# Patient Record
Sex: Female | Born: 1960 | Race: Black or African American | Hispanic: No | Marital: Married | Smoking: Never smoker
Health system: Southern US, Community
[De-identification: ages and names within clinical notes are randomized; demographics above are authoritative.]

## PROBLEM LIST (undated history)

## (undated) DIAGNOSIS — I10 Essential (primary) hypertension: Secondary | ICD-10-CM

---

## 1998-08-25 ENCOUNTER — Emergency Department (HOSPITAL_COMMUNITY): Admission: EM | Admit: 1998-08-25 | Discharge: 1998-08-25 | Payer: Self-pay | Admitting: *Deleted

## 1999-05-26 ENCOUNTER — Other Ambulatory Visit: Admission: RE | Admit: 1999-05-26 | Discharge: 1999-05-26 | Payer: Self-pay | Admitting: Obstetrics and Gynecology

## 2000-06-16 ENCOUNTER — Other Ambulatory Visit: Admission: RE | Admit: 2000-06-16 | Discharge: 2000-06-16 | Payer: Self-pay | Admitting: Gynecology

## 2003-03-05 ENCOUNTER — Emergency Department (HOSPITAL_COMMUNITY): Admission: EM | Admit: 2003-03-05 | Discharge: 2003-03-05 | Payer: Self-pay | Admitting: Emergency Medicine

## 2003-04-25 ENCOUNTER — Encounter: Admission: RE | Admit: 2003-04-25 | Discharge: 2003-04-25 | Payer: Self-pay | Admitting: Internal Medicine

## 2003-06-12 ENCOUNTER — Encounter: Admission: RE | Admit: 2003-06-12 | Discharge: 2003-06-12 | Payer: Self-pay | Admitting: Obstetrics and Gynecology

## 2003-06-12 ENCOUNTER — Other Ambulatory Visit: Admission: RE | Admit: 2003-06-12 | Discharge: 2003-06-12 | Payer: Self-pay | Admitting: Obstetrics & Gynecology

## 2004-03-25 ENCOUNTER — Ambulatory Visit: Payer: Self-pay | Admitting: Obstetrics & Gynecology

## 2004-05-22 ENCOUNTER — Ambulatory Visit: Payer: Self-pay | Admitting: Obstetrics and Gynecology

## 2004-10-14 ENCOUNTER — Emergency Department (HOSPITAL_COMMUNITY): Admission: EM | Admit: 2004-10-14 | Discharge: 2004-10-14 | Payer: Self-pay | Admitting: Family Medicine

## 2005-02-10 ENCOUNTER — Other Ambulatory Visit: Admission: RE | Admit: 2005-02-10 | Discharge: 2005-02-10 | Payer: Self-pay | Admitting: Obstetrics & Gynecology

## 2005-02-10 ENCOUNTER — Ambulatory Visit: Payer: Self-pay | Admitting: *Deleted

## 2005-04-02 ENCOUNTER — Ambulatory Visit: Payer: Self-pay | Admitting: *Deleted

## 2005-04-03 ENCOUNTER — Ambulatory Visit (HOSPITAL_COMMUNITY): Admission: RE | Admit: 2005-04-03 | Discharge: 2005-04-03 | Payer: Self-pay | Admitting: Obstetrics & Gynecology

## 2005-06-09 ENCOUNTER — Other Ambulatory Visit: Admission: RE | Admit: 2005-06-09 | Discharge: 2005-06-09 | Payer: Self-pay | Admitting: Obstetrics & Gynecology

## 2005-06-09 ENCOUNTER — Ambulatory Visit: Payer: Self-pay | Admitting: Obstetrics & Gynecology

## 2005-06-29 ENCOUNTER — Ambulatory Visit: Payer: Self-pay | Admitting: Obstetrics & Gynecology

## 2005-06-29 ENCOUNTER — Inpatient Hospital Stay (HOSPITAL_COMMUNITY): Admission: RE | Admit: 2005-06-29 | Discharge: 2005-07-01 | Payer: Self-pay | Admitting: Obstetrics & Gynecology

## 2005-07-04 ENCOUNTER — Inpatient Hospital Stay (HOSPITAL_COMMUNITY): Admission: AD | Admit: 2005-07-04 | Discharge: 2005-07-04 | Payer: Self-pay | Admitting: *Deleted

## 2005-07-22 ENCOUNTER — Ambulatory Visit: Payer: Self-pay | Admitting: Obstetrics & Gynecology

## 2005-08-13 ENCOUNTER — Ambulatory Visit: Payer: Self-pay | Admitting: *Deleted

## 2012-07-02 ENCOUNTER — Emergency Department (INDEPENDENT_AMBULATORY_CARE_PROVIDER_SITE_OTHER)
Admission: EM | Admit: 2012-07-02 | Discharge: 2012-07-02 | Disposition: A | Discharge status: Home / Self Care | Payer: BC Managed Care – PPO | Source: Home / Self Care | Attending: Family Medicine | Admitting: Family Medicine

## 2012-07-02 ENCOUNTER — Encounter (HOSPITAL_COMMUNITY): Payer: Self-pay | Admitting: *Deleted

## 2012-07-02 DIAGNOSIS — Z760 Encounter for issue of repeat prescription: Secondary | ICD-10-CM

## 2012-07-02 DIAGNOSIS — I10 Essential (primary) hypertension: Secondary | ICD-10-CM

## 2012-07-02 HISTORY — DX: Essential (primary) hypertension: I10

## 2012-07-02 MED ORDER — HYDROCHLOROTHIAZIDE 25 MG PO TABS: 25.0000 mg | ORAL_TABLET | Freq: Every day | ORAL | Status: AC

## 2012-07-02 NOTE — ED Notes (Signed)
Patient states that her BP is up and that she has needs to get a refill on her Hydrochlorothiazide. Patient denies headache and dizziness.

## 2012-07-06 NOTE — ED Provider Notes (Signed)
History     CSN: [id number]  Arrival date & time 07/02/12  1636   First MD Initiated Contact with Patient 07/02/12 1638      Chief Complaint  Patient presents with  . Hypertension    (Consider location/radiation/quality/duration/timing/severity/associated sxs/prior treatment) HPI Comments: 52 year old nonsmoker female with history of hypertension. Here requesting refills on her hydrochlorothiazide. States she ran out of her medications a few days ago. She is in the process of establishing a primary care provider or new patient visit and has an appointment next week. Denies headache, visual changes, chest pain, shortness of breath, leg swelling or PND. She is otherwise feeling well today.   Past Medical History  Diagnosis Date  . Hypertension     History reviewed. No pertinent past surgical history.  No family history on file.  History  Substance Use Topics  . Smoking status: Never Smoker   . Smokeless tobacco: Not on file  . Alcohol Use: No    OB History   Grav Para Term Preterm Abortions TAB SAB Ect Mult Living                  Review of Systems  Constitutional: Negative for diaphoresis, fatigue and unexpected weight change.  Eyes: Negative for visual disturbance.  Respiratory: Negative for chest tightness and shortness of breath.   Cardiovascular: Negative for chest pain, palpitations and leg swelling.  Neurological: Negative for dizziness and headaches.    Allergies  Review of patient's allergies indicates no known allergies.  Home Medications   Current Outpatient Rx  Name  Route  Sig  Dispense  Refill  . hydrochlorothiazide (HYDRODIURIL) 25 MG tablet   Oral   Take 1 tablet (25 mg total) by mouth daily.   30 tablet   1     BP 186/92  Pulse 74  Temp(Src) 98.4 F (36.9 C) (Oral)  Resp 18  SpO2 95%  Physical Exam  Nursing note and vitals reviewed. Constitutional: She is oriented to person, place, and time. She appears well-developed and  well-nourished. No distress.  HENT:  Head: Normocephalic and atraumatic.  Neck: No JVD present.  Cardiovascular: Normal rate, regular rhythm, normal heart sounds and intact distal pulses.  Exam reveals no gallop and no friction rub.   No murmur heard. Pulmonary/Chest: Effort normal and breath sounds normal.  Neurological: She is alert and oriented to person, place, and time.  Skin: No rash noted. She is not diaphoretic.    ED Course  Procedures (including critical care time)  Labs Reviewed - No data to display No results found.   1. Hypertension   2. Encounter for medication refill       MDM  Hydrochlorothiazide refill past previously prescribed. Encouraged patient to followup with primary care clinic new patient's appointment next week. Red flags that should prompt her return to medical attention discussed with patient and provided in writing.        Sharin Grave, MD 07/07/12 [phone]

## 2012-07-14 ENCOUNTER — Encounter: Payer: Self-pay | Admitting: Internal Medicine

## 2012-07-15 ENCOUNTER — Other Ambulatory Visit: Payer: Self-pay | Admitting: Internal Medicine

## 2012-07-15 DIAGNOSIS — N63 Unspecified lump in unspecified breast: Secondary | ICD-10-CM

## 2012-07-26 ENCOUNTER — Other Ambulatory Visit: Payer: BC Managed Care – PPO

## 2012-08-01 ENCOUNTER — Other Ambulatory Visit: Payer: BC Managed Care – PPO

## 2012-08-05 ENCOUNTER — Telehealth: Payer: Self-pay | Admitting: *Deleted

## 2012-08-05 NOTE — Telephone Encounter (Signed)
No show for previsit. No answer on home phone. Sent no show letter.

## 2012-08-08 ENCOUNTER — Ambulatory Visit
Admission: RE | Admit: 2012-08-08 | Discharge: 2012-08-08 | Disposition: A | Discharge status: Home / Self Care | Payer: BC Managed Care – PPO | Source: Ambulatory Visit | Attending: Internal Medicine | Admitting: Internal Medicine

## 2012-08-08 DIAGNOSIS — N63 Unspecified lump in unspecified breast: Secondary | ICD-10-CM

## 2012-08-19 ENCOUNTER — Encounter: Payer: BC Managed Care – PPO | Admitting: Internal Medicine

## 2012-08-31 ENCOUNTER — Encounter: Payer: Self-pay | Admitting: General Practice

## 2013-04-19 ENCOUNTER — Ambulatory Visit: Payer: Self-pay | Admitting: Emergency Medicine

## 2013-04-20 ENCOUNTER — Ambulatory Visit: Payer: BC Managed Care – PPO | Admitting: Physician Assistant

## 2013-06-17 ENCOUNTER — Encounter (HOSPITAL_COMMUNITY): Payer: Self-pay | Admitting: Emergency Medicine

## 2013-06-17 ENCOUNTER — Emergency Department (INDEPENDENT_AMBULATORY_CARE_PROVIDER_SITE_OTHER)
Admission: EM | Admit: 2013-06-17 | Discharge: 2013-06-17 | Disposition: A | Discharge status: Home / Self Care | Payer: BC Managed Care – PPO | Source: Home / Self Care | Attending: Emergency Medicine | Admitting: Emergency Medicine

## 2013-06-17 DIAGNOSIS — J209 Acute bronchitis, unspecified: Secondary | ICD-10-CM

## 2013-06-17 DIAGNOSIS — J019 Acute sinusitis, unspecified: Secondary | ICD-10-CM

## 2013-06-17 MED ORDER — ALBUTEROL SULFATE HFA 108 (90 BASE) MCG/ACT IN AERS: 2.0000 | INHALATION_SPRAY | Freq: Four times a day (QID) | RESPIRATORY_TRACT | Status: AC

## 2013-06-17 MED ORDER — GUAIFENESIN-CODEINE 100-10 MG/5ML PO SYRP: 10.0000 mL | ORAL_SOLUTION | Freq: Four times a day (QID) | ORAL | Status: DC | PRN

## 2013-06-17 MED ORDER — AMOXICILLIN 500 MG PO CAPS: 1000.0000 mg | ORAL_CAPSULE | Freq: Three times a day (TID) | ORAL | Status: DC

## 2013-06-17 MED ORDER — PREDNISONE 20 MG PO TABS: 20.0000 mg | ORAL_TABLET | Freq: Two times a day (BID) | ORAL | Status: DC

## 2013-06-17 MED ORDER — FLUTICASONE PROPIONATE 50 MCG/ACT NA SUSP: 2.0000 | Freq: Every day | NASAL | Status: AC

## 2013-06-17 NOTE — ED Notes (Signed)
C/o cold sx States she has a cough, nasal congestion, sneezing, and sore throat due to coughing OTC medication taken

## 2013-06-17 NOTE — Discharge Instructions (Signed)
Most upper respiratory infections are caused by viruses and do not require antibiotics.  We try to save the antibiotics for when we really need them to prevent bacteria from developing resistance to them.  Here are a few hints about things that can be done at home to help get over an upper respiratory infection quicker: ° °Get extra sleep and extra fluids.  Get 7 to 9 hours of sleep per night and 6 to 8 glasses of water a day.  Getting extra sleep keeps the immune system from getting run down.  Most people with an upper respiratory infection are a little dehydrated.  The extra fluids also keep the secretions liquified and easier to deal with.  Also, get extra vitamin C.  4000 mg per day is the recommended dose. °For the aches, headache, and fever, acetaminophen or ibuprofen are helpful.  These can be alternated every 4 hours.  People with liver disease should avoid large amounts of acetaminophen, and people with ulcer disease, gastroesophageal reflux, gastritis, congestive heart failure, chronic kidney disease, coronary artery disease and the elderly should avoid ibuprofen. °For nasal congestion try Mucinex-D, or if you're having lots of sneezing or clear nasal drainage use Zyrtec-D. People with high blood pressure can take these if their blood pressure is controlled, if not, it's best to avoid the forms with a "D" (decongestants).  You can use the plain Mucinex, Allegra, Claritin, or Zyrtec even if your blood pressure is not controlled.   °A Saline nasal spray such as Ocean Spray can also help.  You can add a decongestant sprays such as Afrin, but you should not use the decongestant sprays for more than 3 or 4 days since they can be habituating.  Breathe Rite nasal strips can also offer a non-drug alternative treatment to nasal congestion, especially at night. °For people with symptoms of sinusitis, sleeping with your head elevated can be helpful.  For sinus pain, moist, hot compresses to the face may provide some  relief.  Many people find that inhaling steam as in a shower or from a pot of steaming water can help. °For any viral infection, zinc containing lozenges such as Cold-Eze or Zicam are helpful.  Zinc helps to fight viral infection.  Hot salt water gargles (8 oz of hot water, 1/2 tsp of table salt, and a pinch of baking soda) can give relief as well as hot beverages such as hot tea.  Sucrets extra strength lozenges will help the sore throat.  °For the cough, take Delsym 2 tsp every 12 hours.  It has also been found recently that Aleve can help control a cough.  The dose is 1 to 2 tablets twice daily with food.  This can be combined with Delsym. (Note, if you are taking ibuprofen, you should not take Aleve as well--take one or the other.) °A cool mist vaporizer will help keep your mucous membranes from drying out.  ° °It's important when you have an upper respiratory infection not to pass the infection to others.  This involves being very careful about the following: ° °Frequent hand washing or use of hand sanitizer, especially after coughing, sneezing, blowing your nose or touching your face, nose or eyes. °Do not shake hands or touch anyone and try to avoid touching surfaces that other people use such as doorknobs, shopping carts, telephones and computer keyboards. °Use tissues and dispose of them properly in a garbage can or ziplock bag. °Cough into your sleeve. °Do not let others eat or   drink after you. ° °It's also important to recognize the signs of serious illness and get evaluated if they occur: °Any respiratory infection that lasts more than 7 to 10 days.  Yellow nasal drainage and sputum are not reliable indicators of a bacterial infection, but if they last for more than 1 week, see your doctor. °Fever and sore throat can indicate strep. °Fever and cough can indicate influenza or pneumonia. °Any kind of severe symptom such as difficulty breathing, intractable vomiting, or severe pain should prompt you to see  a doctor as soon as possible. ° ° °Your body's immune system is really the thing that will get rid of this infection.  Your immune system is comprised of 2 types of specialized cells called T cells and B cells.  T cells coordinate the array of cells in your body that engulf invading bacteria or viruses while B cells orchestrate the production of antibodies that neutralize infection.  Anything we do or any medications we give you, will just strengthen your immune system or help it clear up the infection quicker.  Here are a few helpful hints to improve your immune system to help overcome this illness or to prevent future infections: °· A few vitamins can improve the health of your immune system.  That's why your diet should include plenty of fruits, vegetables, fish, nuts, and whole grains. °· Vitamin A and bet-carotene can increase the cells that fight infections (T cells and B cells).  Vitamin A is abundant in dark greens and orange vegetables such as spinach, greens, sweet potatoes, and carrots. °· Vitamin B6 contributes to the maturation of white blood cells, the cells that fight disease.  Foods with vitamin B6 include cold cereal and bananas. °· Vitamin C is credited with preventing colds because it increases white blood cells and also prevents cellular damage.  Citrus fruits, peaches and green and red bell peppers are all hight in vitamin C. °· Vitamin E is an anti-oxidant that encourages the production of natural killer cells which reject foreign invaders and B cells that produce antibodies.  Foods high in vitamin E include wheat germ, nuts and seeds. °· Foods high in omega-3 fatty acids found in foods like salmon, tuna and mackerel boost your immune system and help cells to engulf and absorb germs. °· Probiotics are good bacteria that increase your T cells.  These can be found in yogurt and are available in supplements such as Culturelle or Align. °· Moderate exercise increases the strength of your immune  system and your ability to recover from illness.  I suggest 3 to 5 moderate intensity 30 minute workouts per week.   °· Sleep is another component of maintaining a strong immune system.  It enables your body to recuperate from the day's activities, stress and work.  My recommendation is to get between 7 and 9 hours of sleep per night. °· If you smoke, try to quit completely or at least cut down.  Drink alcohol only in moderation if at all.  No more than 2 drinks daily for men or 1 for women. °· Get a flu vaccine early in the fall or if you have not gotten one yet, once this illness has run its course.  If you are over 65, a smoker, or an asthmatic, get a pneumococcal vaccine. °· My final recommendation is to maintain a healthy weight.  Excess weight can impair the immune system by interfering with the way the immune system deals with invading viruses or   bacteria. ° ° ° °Bronchitis °Bronchitis is inflammation of the airways that extend from the windpipe into the lungs (bronchi). The inflammation often causes mucus to develop, which leads to a cough. If the inflammation becomes severe, it may cause shortness of breath. °CAUSES  °Bronchitis may be caused by:  °· Viral infections.   °· Bacteria.   °· Cigarette smoke.   °· Allergens, pollutants, and other irritants.   °SIGNS AND SYMPTOMS  °The most common symptom of bronchitis is a frequent cough that produces mucus. Other symptoms include: °· Fever.   °· Body aches.   °· Chest congestion.   °· Chills.   °· Shortness of breath.   °· Sore throat.   °DIAGNOSIS  °Bronchitis is usually diagnosed through a medical history and physical exam. Tests, such as chest X-rays, are sometimes done to rule out other conditions.  °TREATMENT  °You may need to avoid contact with whatever caused the problem (smoking, for example). Medicines are sometimes needed. These may include: °· Antibiotics. These may be prescribed if the condition is caused by bacteria. °· Cough suppressants. These  may be prescribed for relief of cough symptoms.   °· Inhaled medicines. These may be prescribed to help open your airways and make it easier for you to breathe.   °· Steroid medicines. These may be prescribed for those with recurrent (chronic) bronchitis. °HOME CARE INSTRUCTIONS °· Get plenty of rest.   °· Drink enough fluids to keep your urine clear or pale yellow (unless you have a medical condition that requires fluid restriction). Increasing fluids may help thin your secretions and will prevent dehydration.   °· Only take over-the-counter or prescription medicines as directed by your health care provider. °· Only take antibiotics as directed. Make sure you finish them even if you start to feel better. °· Avoid secondhand smoke, irritating chemicals, and strong fumes. These will make bronchitis worse. If you are a smoker, quit smoking. Consider using nicotine gum or skin patches to help control withdrawal symptoms. Quitting smoking will help your lungs heal faster.   °· Put a cool-mist humidifier in your bedroom at night to moisten the air. This may help loosen mucus. Change the water in the humidifier daily. You can also run the hot water in your shower and sit in the bathroom with the door closed for 5 10 minutes.   °· Follow up with your health care provider as directed.   °· Wash your hands frequently to avoid catching bronchitis again or spreading an infection to others.   °SEEK MEDICAL CARE IF: °Your symptoms do not improve after 1 week of treatment.  °SEEK IMMEDIATE MEDICAL CARE IF: °· Your fever increases. °· You have chills.   °· You have chest pain.   °· You have worsening shortness of breath.   °· You have bloody sputum. °· You faint.   °· You have lightheadedness. °· You have a severe headache.   °· You vomit repeatedly. °MAKE SURE YOU:  °· Understand these instructions. °· Will watch your condition. °· Will get help right away if you are not doing well or get worse. °Document Released: 05/04/2005  Document Revised: 02/22/2013 Document Reviewed: 12/27/2012 °Exit[employer]® Patient Information ©2014 [employer]. ° °Sinusitis °Sinusitis is redness, soreness, and swelling (inflammation) of the paranasal sinuses. Paranasal sinuses are air pockets within the bones of your face (beneath the eyes, the middle of the forehead, or above the eyes). In healthy paranasal sinuses, mucus is able to drain out, and air is able to circulate through them by way of your nose. However, when your paranasal sinuses are inflamed, mucus and air can become trapped.   This can allow bacteria and other germs to grow and cause infection. Sinusitis can develop quickly and last only a short time (acute) or continue over a long period (chronic). Sinusitis that lasts for more than 12 weeks is considered chronic.  CAUSES  Causes of sinusitis include:  Allergies.  Structural abnormalities, such as displacement of the cartilage that separates your nostrils (deviated septum), which can decrease the air flow through your nose and sinuses and affect sinus drainage.  Functional abnormalities, such as when the small hairs (cilia) that line your sinuses and help remove mucus do not work properly or are not present. SYMPTOMS  Symptoms of acute and chronic sinusitis are the same. The primary symptoms are pain and pressure around the affected sinuses. Other symptoms include:  Upper toothache.  Earache.  Headache.  Bad breath.  Decreased sense of smell and taste.  A cough, which worsens when you are lying flat.  Fatigue.  Fever.  Thick drainage from your nose, which often is green and may contain pus (purulent).  Swelling and warmth over the affected sinuses. DIAGNOSIS  Your caregiver will perform a physical exam. During the exam, your caregiver may:  Look in your nose for signs of abnormal growths in your nostrils (nasal polyps).  Tap over the affected sinus to check for signs of infection.  View the inside of your  sinuses (endoscopy) with a special imaging device with a light attached (endoscope), which is inserted into your sinuses. If your caregiver suspects that you have chronic sinusitis, one or more of the following tests may be recommended:  Allergy tests.  Nasal culture A sample of mucus is taken from your nose and sent to a lab and screened for bacteria.  Nasal cytology A sample of mucus is taken from your nose and examined by your caregiver to determine if your sinusitis is related to an allergy. TREATMENT  Most cases of acute sinusitis are related to a viral infection and will resolve on their own within 10 days. Sometimes medicines are prescribed to help relieve symptoms (pain medicine, decongestants, nasal steroid sprays, or saline sprays).  However, for sinusitis related to a bacterial infection, your caregiver will prescribe antibiotic medicines. These are medicines that will help kill the bacteria causing the infection.  Rarely, sinusitis is caused by a fungal infection. In theses cases, your caregiver will prescribe antifungal medicine. For some cases of chronic sinusitis, surgery is needed. Generally, these are cases in which sinusitis recurs more than 3 times per year, despite other treatments. HOME CARE INSTRUCTIONS   Drink plenty of water. Water helps thin the mucus so your sinuses can drain more easily.  Use a humidifier.  Inhale steam 3 to 4 times a day (for example, sit in the bathroom with the shower running).  Apply a warm, moist washcloth to your face 3 to 4 times a day, or as directed by your caregiver.  Use saline nasal sprays to help moisten and clean your sinuses.  Take over-the-counter or prescription medicines for pain, discomfort, or fever only as directed by your caregiver. SEEK IMMEDIATE MEDICAL CARE IF:  You have increasing pain or severe headaches.  You have nausea, vomiting, or drowsiness.  You have swelling around your face.  You have vision  problems.  You have a stiff neck.  You have difficulty breathing. MAKE SURE YOU:   Understand these instructions.  Will watch your condition.  Will get help right away if you are not doing well or get worse. Document Released:  05/04/2005 Document Revised: 07/27/2011 Document Reviewed: 05/19/2011 Exit[employer] Patient Information 2014 [place]. How to Use an Inhaler Proper inhaler technique is very important. Good technique ensures that the medicine reaches the lungs. Poor technique results in depositing the medicine on the tongue and back of the throat rather than in the airways. If you do not use the inhaler with good technique, the medicine will not help you. STEPS TO FOLLOW IF USING AN INHALER WITHOUT AN EXTENSION TUBE 1. Remove the cap from the inhaler. 2. If you are using the inhaler for the first time, you will need to prime it. Shake the inhaler for 5 seconds and release four puffs into the air, away from your face. Ask your health care provider or pharmacist if you have questions about priming your inhaler. 3. Shake the inhaler for 5 seconds before each breath in (inhalation). 4. Position the inhaler so that the top of the canister faces up. 5. Put your index finger on the top of the medicine canister. Your thumb supports the bottom of the inhaler. 6. Open your mouth. 7. Either place the inhaler between your teeth and place your lips tightly around the mouthpiece, or hold the inhaler 1 2 inches away from your open mouth. If you are unsure of which technique to use, ask your health care provider. 8. Breathe out (exhale) normally and as completely as possible. 9. Press the canister down with your index finger to release the medicine. 10. At the same time as the canister is pressed, inhale deeply and slowly until your lungs are completely filled. This should take 4 6 seconds. Keep your tongue down. 11. Hold the medicine in your lungs for 5 10 seconds (10 seconds is best). This helps  the medicine get into the small airways of your lungs. 12. Breathe out slowly, through pursed lips. Whistling is an example of pursed lips. 13. Wait at least 15 30 seconds between puffs. Continue with the above steps until you have taken the number of puffs your health care provider has ordered. Do not use the inhaler more than your health care provider tells you. 14. Replace the cap on the inhaler. 15. Follow the directions from your health care provider or the inhaler insert for cleaning the inhaler. STEPS TO FOLLOW IF USING AN INHALER WITH AN EXTENSION (SPACER) 1. Remove the cap from the inhaler. 2. If you are using the inhaler for the first time, you will need to prime it. Shake the inhaler for 5 seconds and release four puffs into the air, away from your face. Ask your health care provider or pharmacist if you have questions about priming your inhaler. 3. Shake the inhaler for 5 seconds before each breath in (inhalation). 4. Place the open end of the spacer onto the mouthpiece of the inhaler. 5. Position the inhaler so that the top of the canister faces up and the spacer mouthpiece faces you. 6. Put your index finger on the top of the medicine canister. Your thumb supports the bottom of the inhaler and the spacer. 7. Breathe out (exhale) normally and as completely as possible. 8. Immediately after exhaling, place the spacer between your teeth and into your mouth. Close your lips tightly around the spacer. 9. Press the canister down with your index finger to release the medicine. 10. At the same time as the canister is pressed, inhale deeply and slowly until your lungs are completely filled. This should take 4 6 seconds. Keep your tongue down and out of the  way. 11. Hold the medicine in your lungs for 5 10 seconds (10 seconds is best). This helps the medicine get into the small airways of your lungs. Exhale. 12. Repeat inhaling deeply through the spacer mouthpiece. Again hold that breath for up  to 10 seconds (10 seconds is best). Exhale slowly. If it is difficult to take this second deep breath through the spacer, breathe normally several times through the spacer. Remove the spacer from your mouth. 13. Wait at least 15 30 seconds between puffs. Continue with the above steps until you have taken the number of puffs your health care provider has ordered. Do not use the inhaler more than your health care provider tells you. 14. Remove the spacer from the inhaler, and place the cap on the inhaler. 15. Follow the directions from your health care provider or the inhaler insert for cleaning the inhaler and spacer. If you are using different kinds of inhalers, use your quick relief medicine to open the airways 10 15 minutes before using a steroid if instructed to do so by your health care provider. If you are unsure which inhalers to use and the order of using them, ask your health care provider, nurse, or respiratory therapist. If you are using a steroid inhaler, always rinse your mouth with water after your last puff, then gargle and spit out the water. Do not swallow the water. AVOID:  Inhaling before or after starting the spray of medicine. It takes practice to coordinate your breathing with triggering the spray.  Inhaling through the nose (rather than the mouth) when triggering the spray. HOW TO DETERMINE IF YOUR INHALER IS FULL OR NEARLY EMPTY You cannot know when an inhaler is empty by shaking it. A few inhalers are now being made with dose counters. Ask your health care provider for a prescription that has a dose counter if you feel you need that extra help. If your inhaler does not have a counter, ask your health care provider to help you determine the date you need to refill your inhaler. Write the refill date on a calendar or your inhaler canister. Refill your inhaler 7 10 days before it runs out. Be sure to keep an adequate supply of medicine. This includes making sure it is not expired,  and that you have a spare inhaler.  SEEK MEDICAL CARE IF:   Your symptoms are only partially relieved with your inhaler.  You are having trouble using your inhaler.  You have some increase in phlegm. SEEK IMMEDIATE MEDICAL CARE IF:   You feel little or no relief with your inhalers. You are still wheezing and are feeling shortness of breath or tightness in your chest or both.  You have dizziness, headaches, or a fast heart rate.  You have chills, fever, or night sweats.  You have a noticeable increase in phlegm production, or there is blood in the phlegm. MAKE SURE YOU:   Understand these instructions.  Will watch your condition.  Will get help right away if you are not doing well or get worse. Document Released: 05/01/2000 Document Revised: 02/22/2013 Document Reviewed: 12/01/2012 [facility] Patient Information 2014 [place], [place].

## 2013-06-17 NOTE — ED Provider Notes (Signed)
Chief Complaint   Chief Complaint  Patient presents with  . URI    History of Present Illness   Grace Warner is a 53 year old female who's had a one-month history of waxing and waning symptoms of nasal congestion, cough, and sore throat. Her symptoms have never completely gotten better, although it did get better and worse. She's had yellowish nasal drainage, sneezing, yellow sputum production, and wheezing. She denies any headache, sinus pressure, ear congestion, chest pain, fever, chills, or GI symptoms. She has no history of asthma or allergies.  Review of Systems   Other than as noted above, the patient denies any of the following symptoms: Systemic:  No fevers, chills, sweats, or myalgias. Eye:  No redness or discharge. ENT:  No ear pain, headache, nasal congestion, drainage, sinus pressure, or sore throat. Neck:  No neck pain, stiffness, or swollen glands. Lungs:  No cough, sputum production, hemoptysis, wheezing, chest tightness, shortness of breath or chest pain. GI:  No abdominal pain, nausea, vomiting or diarrhea.  PMFSH   Past medical history, family history, social history, meds, and allergies were reviewed. She is taking hydrochlorothiazide for high blood pressure.  Physical exam   Vital signs:  BP 171/95  Pulse 96  Temp(Src) 99.2 F (37.3 C) (Oral)  Resp 18  SpO2 98% General:  Alert and oriented.  In no distress.  Skin warm and dry. Eye:  No conjunctival injection or drainage. Lids were normal. ENT:  TMs and canals were normal, without erythema or inflammation.  Nasal mucosa was clear and uncongested, without drainage.  Mucous membranes were moist.  Pharynx was clear with no exudate or drainage.  There were no oral ulcerations or lesions. Neck:  Supple, no adenopathy, tenderness or mass. Lungs:  No respiratory distress.  Lungs were clear to auscultation, without wheezes, rales or rhonchi.  Breath sounds were clear and equal bilaterally.  Heart:  Regular rhythm,  without gallops, murmers or rubs. Skin:  Clear, warm, and dry, without rash or lesions.  Assessment     The primary encounter diagnosis was Acute bronchitis. A diagnosis of Acute sinusitis was also pertinent to this visit.  Plan    1.  Meds:  The following meds were prescribed:   Discharge Medication List as of 06/17/2013  3:31 PM    START taking these medications   Details  albuterol (PROVENTIL HFA;VENTOLIN HFA) 108 (90 BASE) MCG/ACT inhaler Inhale 2 puffs into the lungs 4 (four) times daily., Starting 06/17/2013, Until Discontinued, Normal    amoxicillin (AMOXIL) 500 MG capsule Take 2 capsules (1,000 mg total) by mouth 3 (three) times daily., Starting 06/17/2013, Until Discontinued, Normal    fluticasone (FLONASE) 50 MCG/ACT nasal spray Place 2 sprays into both nostrils daily., Starting 06/17/2013, Until Discontinued, Normal    guaiFENesin-codeine (GUIATUSS AC) 100-10 MG/5ML syrup Take 10 mLs by mouth 4 (four) times daily as needed for cough., Starting 06/17/2013, Until Discontinued, Print    predniSONE (DELTASONE) 20 MG tablet Take 1 tablet (20 mg total) by mouth 2 (two) times daily., Starting 06/17/2013, Until Discontinued, Normal        2.  Patient Education/Counseling:  The patient was given appropriate handouts, self care instructions, and instructed in symptomatic relief.  Instructed to get extra fluids, rest, and use a cool mist vaporizer.    3.  Follow up:  The patient was told to follow up here if no better in 3 to 4 days, or sooner if becoming worse in any way, and given some red  flag symptoms such as increasing fever, difficulty breathing, chest pain, or persistent vomiting which would prompt immediate return.  Follow up here as needed.      Reuben Likes, MD 06/17/13 [phone]

## 2013-07-18 ENCOUNTER — Emergency Department (INDEPENDENT_AMBULATORY_CARE_PROVIDER_SITE_OTHER)
Admission: EM | Admit: 2013-07-18 | Discharge: 2013-07-18 | Disposition: A | Discharge status: Home / Self Care | Payer: BC Managed Care – PPO | Source: Home / Self Care | Attending: Emergency Medicine | Admitting: Emergency Medicine

## 2013-07-18 ENCOUNTER — Encounter (HOSPITAL_COMMUNITY): Payer: Self-pay | Admitting: Emergency Medicine

## 2013-07-18 DIAGNOSIS — R059 Cough, unspecified: Secondary | ICD-10-CM

## 2013-07-18 DIAGNOSIS — R05 Cough: Secondary | ICD-10-CM

## 2013-07-18 LAB — POCT RAPID STREP A: Streptococcus, Group A Screen (Direct): NEGATIVE

## 2013-07-18 MED ORDER — OMEPRAZOLE 20 MG PO CPDR: 20.0000 mg | DELAYED_RELEASE_CAPSULE | Freq: Every day | ORAL | Status: AC

## 2013-07-18 MED ORDER — HYDROCOD POLST-CHLORPHEN POLST 10-8 MG/5ML PO LQCR: 5.0000 mL | Freq: Two times a day (BID) | ORAL | Status: DC | PRN

## 2013-07-18 MED ORDER — BUDESONIDE-FORMOTEROL FUMARATE 160-4.5 MCG/ACT IN AERO: 2.0000 | INHALATION_SPRAY | Freq: Two times a day (BID) | RESPIRATORY_TRACT | Status: AC

## 2013-07-18 MED ORDER — AMOXICILLIN-POT CLAVULANATE 875-125 MG PO TABS: 1.0000 | ORAL_TABLET | Freq: Two times a day (BID) | ORAL | Status: DC

## 2013-07-18 NOTE — ED Provider Notes (Signed)
Chief Complaint   Chief Complaint  Patient presents with  . Cough    History of Present Illness   Kathrin RuddyGloria Stevison is a female since today with a three-day history of dry cough, wheezing, nasal congestion, rhinorrhea with clear drainage, headache, and sore throat. She denies any fever, chills, purulent nasal drainage, chest pain, or GI symptoms, although she has had some acid reflux. She has no history of asthma or allergies. She denies any sinus pain. She was here about 3 weeks ago with similar symptoms and was diagnosed as having bronchitis and sinusitis. She was treated at that time with amoxicillin, albuterol, Tussionex, prednisone and Flonase. The patient states her symptoms cleared up completely but then read occurred again the past 3 days.  Review of Systems   Other than as noted above, the patient denies any of the following symptoms: Systemic:  No fevers, chills, sweats, weight loss or gain. ENT:  No nasal congestion, sneezing, itching, postnasal drip, sinus pressure, headache, sore throat, or hoarseness. Lungs:  No wheezing, shortness of breath, chest tightness or congestion. Heart:  No chest pain, tightness, pressure, PND, orthopnea, or ankle edema. GI:  No indigestion, heartburn, waterbrash, burping, abdominal pain, nausea, or vomiting.  PMFSH   Past medical history, family history, social history, meds, and allergies were reviewed. She takes hydrochlorothiazide for high blood pressure.  Physical Examination     Vital signs:  BP 166/92  Pulse 82  Temp(Src) 98.5 F (36.9 C) (Oral)  Resp 18  SpO2 100% General:  Alert and oriented.  In no distress.  Skin warm and dry. ENT: TMs and ear canals normal.  Nasal mucosa normal, without drainage.  Pharynx clear without exudate or drainage.  No intraoral lesions. Neck:  No adenopathy, tenderness or mass.  No JVD. Lungs:  No respiratory distress.  Breath sounds clear and equal bilaterally.  No wheezes, rales or rhonchi. Heart:  Regular  rhythm, no gallops or murmers.  No pedal edema. Abdomon:  Soft and nontender.  No organomegaly or mass.  Assessment   The encounter diagnosis was Cough.  Differential diagnosis includes acute viral illness, chronic postnasal drainage from allergic rhinitis or chronic sinusitis above, asthma, or reflux. I suggested that she have pulmonary function testing with and without bronchodilator, and she will call her primary care physician to get this set up. In the meantime will initiate Symbicort and also add Tussionex, Prilosec for reflux control, and Augmentin.  Plan     1.  Meds:  The following meds were prescribed:   Discharge Medication List as of 07/18/2013  5:17 PM    START taking these medications   Details  amoxicillin-clavulanate (AUGMENTIN) 875-125 MG per tablet Take 1 tablet by mouth 2 (two) times daily., Starting 07/18/2013, Until Discontinued, Normal    budesonide-formoterol (SYMBICORT) 160-4.5 MCG/ACT inhaler Inhale 2 puffs into the lungs 2 (two) times daily., Starting 07/18/2013, Until Discontinued, Normal    chlorpheniramine-HYDROcodone (TUSSIONEX) 10-8 MG/5ML LQCR Take 5 mLs by mouth every 12 (twelve) hours as needed for cough., Starting 07/18/2013, Until Discontinued, Normal    omeprazole (PRILOSEC) 20 MG capsule Take 1 capsule (20 mg total) by mouth daily., Starting 07/18/2013, Until Discontinued, Normal        2.  Patient Education/Counseling:  The patient was given appropriate handouts, self care instructions, and instructed in symptomatic relief.    3.  Follow up:  The patient was told to follow up here if no better in 3 to 4 days, or sooner if becoming worse  in any way, and given some red flag symptoms such as difficulty breathing or chest pain which would prompt immediate return.         Reuben Likes, MD 07/18/13 1901

## 2013-07-18 NOTE — Discharge Instructions (Signed)
See your primary care doctor and get them to order a test for asthma called:  Pulmonary function tests with and without bronchodialator.     Cough, Adult  A cough is a reflex that helps clear your throat and airways. It can help heal the body or may be a reaction to an irritated airway. A cough may only last 2 or 3 weeks (acute) or may last more than 8 weeks (chronic).  CAUSES Acute cough:  Viral or bacterial infections. Chronic cough:  Infections.  Allergies.  Asthma.  Post-nasal drip.  Smoking.  Heartburn or acid reflux.  Some medicines.  Chronic lung problems (COPD).  Cancer. SYMPTOMS   Cough.  Fever.  Chest pain.  Increased breathing rate.  High-pitched whistling sound when breathing (wheezing).  Colored mucus that you cough up (sputum). TREATMENT   A bacterial cough may be treated with antibiotic medicine.  A viral cough must run its course and will not respond to antibiotics.  Your caregiver may recommend other treatments if you have a chronic cough. HOME CARE INSTRUCTIONS   Only take over-the-counter or prescription medicines for pain, discomfort, or fever as directed by your caregiver. Use cough suppressants only as directed by your caregiver.  Use a cold steam vaporizer or humidifier in your bedroom or home to help loosen secretions.  Sleep in a semi-upright position if your cough is worse at night.  Rest as needed.  Stop smoking if you smoke. SEEK IMMEDIATE MEDICAL CARE IF:   You have pus in your sputum.  Your cough starts to worsen.  You cannot control your cough with suppressants and are losing sleep.  You begin coughing up blood.  You have difficulty breathing.  You develop pain which is getting worse or is uncontrolled with medicine.  You have a fever. MAKE SURE YOU:   Understand these instructions.  Will watch your condition.  Will get help right away if you are not doing well or get worse. Document Released: 10/31/2010  Document Revised: 07/27/2011 Document Reviewed: 10/31/2010 [employer]ExitCare Patient Information 2014 [employer] [place].

## 2013-07-18 NOTE — ED Notes (Signed)
C/o dry nonproductive cough x 3 days.  Pt recently treated 3 wks ago for bronchitis and sinus infection.   States finished prescribed meds.

## 2013-07-20 LAB — CULTURE, GROUP A STREP

## 2015-05-16 ENCOUNTER — Emergency Department (INDEPENDENT_AMBULATORY_CARE_PROVIDER_SITE_OTHER)
Admission: EM | Admit: 2015-05-16 | Discharge: 2015-05-16 | Disposition: A | Discharge status: Home / Self Care | Payer: BLUE CROSS/BLUE SHIELD | Source: Home / Self Care | Attending: Family Medicine | Admitting: Family Medicine

## 2015-05-16 ENCOUNTER — Emergency Department (INDEPENDENT_AMBULATORY_CARE_PROVIDER_SITE_OTHER): Payer: BLUE CROSS/BLUE SHIELD

## 2015-05-16 ENCOUNTER — Encounter (HOSPITAL_COMMUNITY): Payer: Self-pay | Admitting: Emergency Medicine

## 2015-05-16 DIAGNOSIS — R141 Gas pain: Secondary | ICD-10-CM

## 2015-05-16 DIAGNOSIS — R1032 Left lower quadrant pain: Secondary | ICD-10-CM

## 2015-05-16 DIAGNOSIS — K5901 Slow transit constipation: Secondary | ICD-10-CM

## 2015-05-16 LAB — POCT URINALYSIS DIP (DEVICE)
Bilirubin Urine: NEGATIVE
GLUCOSE, UA: NEGATIVE mg/dL
Hgb urine dipstick: NEGATIVE
Ketones, ur: NEGATIVE mg/dL
Nitrite: NEGATIVE
Protein, ur: NEGATIVE mg/dL
Specific Gravity, Urine: 1.01 (ref 1.005–1.030)
Urobilinogen, UA: 0.2 mg/dL (ref 0.0–1.0)
pH: 6.5 (ref 5.0–8.0)

## 2015-05-16 NOTE — Discharge Instructions (Signed)
Constipation, Adult Miralax as directed, more fiber and water in diet If not improving see your doctor or if worse go to the ED Constipation is when a person has fewer than three bowel movements a week, has difficulty having a bowel movement, or has stools that are dry, hard, or larger than normal. As people grow older, constipation is more common. A low-fiber diet, not taking in enough fluids, and taking certain medicines may make constipation worse.  CAUSES   Certain medicines, such as antidepressants, pain medicine, iron supplements, antacids, and water pills.   Certain diseases, such as diabetes, irritable bowel syndrome (IBS), thyroid disease, or depression.   Not drinking enough water.   Not eating enough fiber-rich foods.   Stress or travel.   Lack of physical activity or exercise.   Ignoring the urge to have a bowel movement.   Using laxatives too much.  SIGNS AND SYMPTOMS   Having fewer than three bowel movements a week.   Straining to have a bowel movement.   Having stools that are hard, dry, or larger than normal.   Feeling full or bloated.   Pain in the lower abdomen.   Not feeling relief after having a bowel movement.  DIAGNOSIS  Your health care provider will take a medical history and perform a physical exam. Further testing may be done for severe constipation. Some tests may include:  A barium enema X-ray to examine your rectum, colon, and, sometimes, your small intestine.   A sigmoidoscopy to examine your lower colon.   A colonoscopy to examine your entire colon. TREATMENT  Treatment will depend on the severity of your constipation and what is causing it. Some dietary treatments include drinking more fluids and eating more fiber-rich foods. Lifestyle treatments may include regular exercise. If these diet and lifestyle recommendations do not help, your health care provider may recommend taking over-the-counter laxative medicines to help you  have bowel movements. Prescription medicines may be prescribed if over-the-counter medicines do not work.  HOME CARE INSTRUCTIONS   Eat foods that have a lot of fiber, such as fruits, vegetables, whole grains, and beans.  Limit foods high in fat and processed sugars, such as french fries, hamburgers, cookies, candies, and soda.   A fiber supplement may be added to your diet if you cannot get enough fiber from foods.   Drink enough fluids to keep your urine clear or pale yellow.   Exercise regularly or as directed by your health care provider.   Go to the restroom when you have the urge to go. Do not hold it.   Only take over-the-counter or prescription medicines as directed by your health care provider. Do not take other medicines for constipation without talking to your health care provider first.  SEEK IMMEDIATE MEDICAL CARE IF:   You have bright red blood in your stool.   Your constipation lasts for more than 4 days or gets worse.   You have abdominal or rectal pain.   You have thin, pencil-like stools.   You have unexplained weight loss. MAKE SURE YOU:   Understand these instructions.  Will watch your condition.  Will get help right away if you are not doing well or get worse.   This information is not intended to replace advice given to you by your health care provider. Make sure you discuss any questions you have with your health care provider.   Document Released: 01/31/2004 Document Revised: 05/25/2014 Document Reviewed: 02/13/2013 Elsevier Interactive Patient Education 2016 Elsevier  Inc.  Abdominal Pain, Adult Many things can cause belly (abdominal) pain. Most times, the belly pain is not dangerous. Many cases of belly pain can be watched and treated at home. HOME CARE   Do not take medicines that help you go poop (laxatives) unless told to by your doctor.  Only take medicine as told by your doctor.  Eat or drink as told by your doctor. Your doctor  will tell you if you should be on a special diet. GET HELP IF:  You do not know what is causing your belly pain.  You have belly pain while you are sick to your stomach (nauseous) or have runny poop (diarrhea).  You have pain while you pee or poop.  Your belly pain wakes you up at night.  You have belly pain that gets worse or better when you eat.  You have belly pain that gets worse when you eat fatty foods.  You have a fever. GET HELP RIGHT AWAY IF:   The pain does not go away within 2 hours.  You keep throwing up (vomiting).  The pain changes and is only in the right or left part of the belly.  You have bloody or tarry looking poop. MAKE SURE YOU:   Understand these instructions.  Will watch your condition.  Will get help right away if you are not doing well or get worse.   This information is not intended to replace advice given to you by your health care provider. Make sure you discuss any questions you have with your health care provider.   Document Released: 10/21/2007 Document Revised: 05/25/2014 Document Reviewed: 01/11/2013 Elsevier Interactive Patient Education 2016 Elsevier Inc.  High-Fiber Diet Fiber, also called dietary fiber, is a type of carbohydrate found in fruits, vegetables, whole grains, and beans. A high-fiber diet can have many health benefits. Your health care provider may recommend a high-fiber diet to help:  Prevent constipation. Fiber can make your bowel movements more regular.  Lower your cholesterol.  Relieve hemorrhoids, uncomplicated diverticulosis, or irritable bowel syndrome.  Prevent overeating as part of a weight-loss plan.  Prevent heart disease, type 2 diabetes, and certain cancers. WHAT IS MY PLAN? The recommended daily intake of fiber includes:  38 grams for men under age 40.  30 grams for men over age 74.  25 grams for women under age 86.  21 grams for women over age 33. You can get the recommended daily intake of  dietary fiber by eating a variety of fruits, vegetables, grains, and beans. Your health care provider may also recommend a fiber supplement if it is not possible to get enough fiber through your diet. WHAT DO I NEED TO KNOW ABOUT A HIGH-FIBER DIET?  Fiber supplements have not been widely studied for their effectiveness, so it is better to get fiber through food sources.  Always check the fiber content on thenutrition facts label of any prepackaged food. Look for foods that contain at least 5 grams of fiber per serving.  Ask your dietitian if you have questions about specific foods that are related to your condition, especially if those foods are not listed in the following section.  Increase your daily fiber consumption gradually. Increasing your intake of dietary fiber too quickly may cause bloating, cramping, or gas.  Drink plenty of water. Water helps you to digest fiber. WHAT FOODS CAN I EAT? Grains Whole-grain breads. Multigrain cereal. Oats and oatmeal. Brown rice. Barley. Bulgur wheat. Millet. Bran muffins. Popcorn. Rye wafer crackers.  Vegetables Sweet potatoes. Spinach. Kale. Artichokes. Cabbage. Broccoli. Green peas. Carrots. Squash. Fruits Berries. Pears. Apples. Oranges. Avocados. Prunes and raisins. Dried figs. Meats and Other Protein Sources Navy, kidney, pinto, and soy beans. Split peas. Lentils. Nuts and seeds. Dairy Fiber-fortified yogurt. Beverages Fiber-fortified soy milk. Fiber-fortified orange juice. Other Fiber bars. The items listed above may not be a complete list of recommended foods or beverages. Contact your dietitian for more options. WHAT FOODS ARE NOT RECOMMENDED? Grains White bread. Pasta made with refined flour. White rice. Vegetables Fried potatoes. Canned vegetables. Well-cooked vegetables.  Fruits Fruit juice. Cooked, strained fruit. Meats and Other Protein Sources Fatty cuts of meat. Fried Environmental education officerpoultry or fried fish. Dairy Milk. Yogurt. Cream  cheese. Sour cream. Beverages Soft drinks. Other Cakes and pastries. Butter and oils. The items listed above may not be a complete list of foods and beverages to avoid. Contact your dietitian for more information. WHAT ARE SOME TIPS FOR INCLUDING HIGH-FIBER FOODS IN MY DIET?  Eat a wide variety of high-fiber foods.  Make sure that half of all grains consumed each day are whole grains.  Replace breads and cereals made from refined flour or white flour with whole-grain breads and cereals.  Replace white rice with brown rice, bulgur wheat, or millet.  Start the day with a breakfast that is high in fiber, such as a cereal that contains at least 5 grams of fiber per serving.  Use beans in place of meat in soups, salads, or pasta.  Eat high-fiber snacks, such as berries, raw vegetables, nuts, or popcorn.   This information is not intended to replace advice given to you by your health care provider. Make sure you discuss any questions you have with your health care provider.   Document Released: 05/04/2005 Document Revised: 05/25/2014 Document Reviewed: 10/17/2013 Elsevier Interactive Patient Education [employer] Elsevier Inc.

## 2015-05-16 NOTE — ED Notes (Signed)
Complains of lower left abdominal pain.  Pain for 2 weeks.  Reports a small, soft stool this morning.  Last "normal" stool was 2 weeks ago.  Patient denies vaginal discharge.  Patient denies urinary symptoms.

## 2015-05-16 NOTE — ED Provider Notes (Signed)
CSN: [id number]     Arrival date & time 05/16/15  1721 History   First MD Initiated Contact with Patient 05/16/15 1848     Chief Complaint  Patient presents with  . Abdominal Pain   (Consider location/radiation/quality/duration/timing/severity/associated sxs/prior Treatment) HPI Comments: 54 year old female complaining of left lower quadrant pain for 2 weeks. Pain is described as a crampy discomfort. It occurs once every 2-3 days. It is intermittent. It is worse after eating and often better after having a bowel movement. She states she has daily bowel movements. She denies nausea, vomiting or fever. Denies upper abdominal pain or tenderness. Hx of hysterectomy.   Past Medical History  Diagnosis Date  . Hypertension    No past surgical history on file. No family history on file. Social History  Substance Use Topics  . Smoking status: Never Smoker   . Smokeless tobacco: None  . Alcohol Use: No   OB History    No data available     Review of Systems  Constitutional: Negative for fever, activity change and fatigue.  HENT: Negative.   Respiratory: Negative.   Cardiovascular: Negative.   Gastrointestinal: Positive for abdominal pain. Negative for nausea, vomiting, diarrhea and blood in stool.  Genitourinary: Negative for dysuria, frequency, vaginal bleeding, vaginal discharge, menstrual problem and pelvic pain.  Neurological: Negative.     Allergies  Review of patient's allergies indicates no known allergies.  Home Medications   Prior to Admission medications   Medication Sig Start Date End Date Taking? Authorizing Provider  albuterol (PROVENTIL HFA;VENTOLIN HFA) 108 (90 BASE) MCG/ACT inhaler Inhale 2 puffs into the lungs 4 (four) times daily. 06/17/13   Reuben Likesavid C Keller, MD  amoxicillin (AMOXIL) 500 MG capsule Take 2 capsules (1,000 mg total) by mouth 3 (three) times daily. Patient not taking: Reported on 05/16/2015 06/17/13   Reuben Likesavid C Keller, MD  amoxicillin-clavulanate  (AUGMENTIN) 875-125 MG per tablet Take 1 tablet by mouth 2 (two) times daily. Patient not taking: Reported on 05/16/2015 07/18/13   Reuben Likesavid C Keller, MD  budesonide-formoterol [facility](SYMBICORT) 160-4.5 MCG/ACT inhaler Inhale 2 puffs into the lungs 2 (two) times daily. 07/18/13   Reuben Likesavid C Keller, MD  chlorpheniramine-HYDROcodone (TUSSIONEX) 10-8 MG/5ML LQCR Take 5 mLs by mouth every 12 (twelve) hours as needed for cough. Patient not taking: Reported on 05/16/2015 07/18/13   Reuben Likesavid C Keller, MD  fluticasone [facility](FLONASE) 50 MCG/ACT nasal spray Place 2 sprays into both nostrils daily. 06/17/13   Reuben Likesavid C Keller, MD  guaiFENesin-codeine [facility](GUIATUSS AC) 100-10 MG/5ML syrup Take 10 mLs by mouth 4 (four) times daily as needed for cough. Patient not taking: Reported on 05/16/2015 06/17/13   Reuben Likesavid C Keller, MD  hydrochlorothiazide (HYDRODIURIL) 25 MG tablet Take 1 tablet (25 mg total) by mouth daily. 07/02/12   Adlih Moreno-Coll, MD  omeprazole (PRILOSEC) 20 MG capsule Take 1 capsule (20 mg total) by mouth daily. 07/18/13   Reuben Likesavid C Keller, MD  predniSONE (DELTASONE) 20 MG tablet Take 1 tablet (20 mg total) by mouth 2 (two) times daily. Patient not taking: Reported on 05/16/2015 06/17/13   Reuben Likesavid C Keller, MD   Meds Ordered and Administered this Visit  Medications - No data to display  BP 164/91 mmHg  Pulse 80  Temp(Src) 98 F (36.7 C) (Oral)  Resp 12  SpO2 98% No data found.   Physical Exam  Constitutional: She is oriented to person, place, and time. She appears well-developed and well-nourished. No distress.  Neck: Normal range of motion. Neck supple.  Cardiovascular: Normal rate, regular rhythm and normal heart sounds.   Pulmonary/Chest: Effort normal and breath sounds normal. No respiratory distress. She has no wheezes. She has no rales.  Abdominal: Soft. Bowel sounds are normal. She exhibits no mass. There is no rebound and no guarding.  Minor distention. Not tense. Tenderness to the left lower quadrant. No other  areas of abdominal tenderness.  Musculoskeletal: Normal range of motion. She exhibits no edema.  Neurological: She is alert and oriented to person, place, and time. She exhibits normal muscle tone.  Skin: Skin is warm.  Psychiatric: She has a normal mood and affect.  Nursing note and vitals reviewed.   ED Course  Procedures (including critical care time)  Labs Review Labs Reviewed  POCT URINALYSIS DIP (DEVICE) - Abnormal; Notable for the following:    Leukocytes, UA TRACE (*)    All other components within normal limits   Results for orders placed or performed during the hospital encounter of 05/16/15  POCT urinalysis dip (device)  Result Value Ref Range   Glucose, UA NEGATIVE NEGATIVE mg/dL   Bilirubin Urine NEGATIVE NEGATIVE   Ketones, ur NEGATIVE NEGATIVE mg/dL   Specific Gravity, Urine 1.010 1.005 - 1.030   Hgb urine dipstick NEGATIVE NEGATIVE   pH 6.5 5.0 - 8.0   Protein, ur NEGATIVE NEGATIVE mg/dL   Urobilinogen, UA 0.2 0.0 - 1.0 mg/dL   Nitrite NEGATIVE NEGATIVE   Leukocytes, UA TRACE (A) NEGATIVE     Imaging Review Dg Abd 1 View  05/16/2015  CLINICAL DATA:  Initial encounter for 2 week history of left lower side pain. EXAM: ABDOMEN - 1 VIEW COMPARISON:  None. FINDINGS: The bowel gas pattern is normal. No radio-opaque calculi or other significant radiographic abnormality are seen. IMPRESSION: Negative. Electronically Signed   By: Kennith Center M.D.   On: 05/16/2015 20:06     Visual Acuity Review  Right Eye Distance:   Left Eye Distance:   Bilateral Distance:    Right Eye Near:   Left Eye Near:    Bilateral Near:         MDM   1. Left lower quadrant pain   2. Abdominal gas pain   3. Slow transit constipation    Likely colonic spasms due to slow transit:/Constipation. Recommended increased fiber in your diet water and MiraLAX to clean out. If not improved in a couple days or if there is worsening or new symptoms or problems such as fever, increased  pain, blood in the stool, nausea vomiting or other problems sig medical attention promptly.   Hayden Rasmussen, NP 05/16/15 2056

## 2016-07-05 IMAGING — DX DG ABDOMEN 1V
2 series · 2 of 2 positions shown · non-contrast
Comparison: None.

CLINICAL DATA: Initial encounter for 2 week history of left lower
side pain.

EXAM:
ABDOMEN - 1 VIEW

[abdomen kub (1 of 2)]
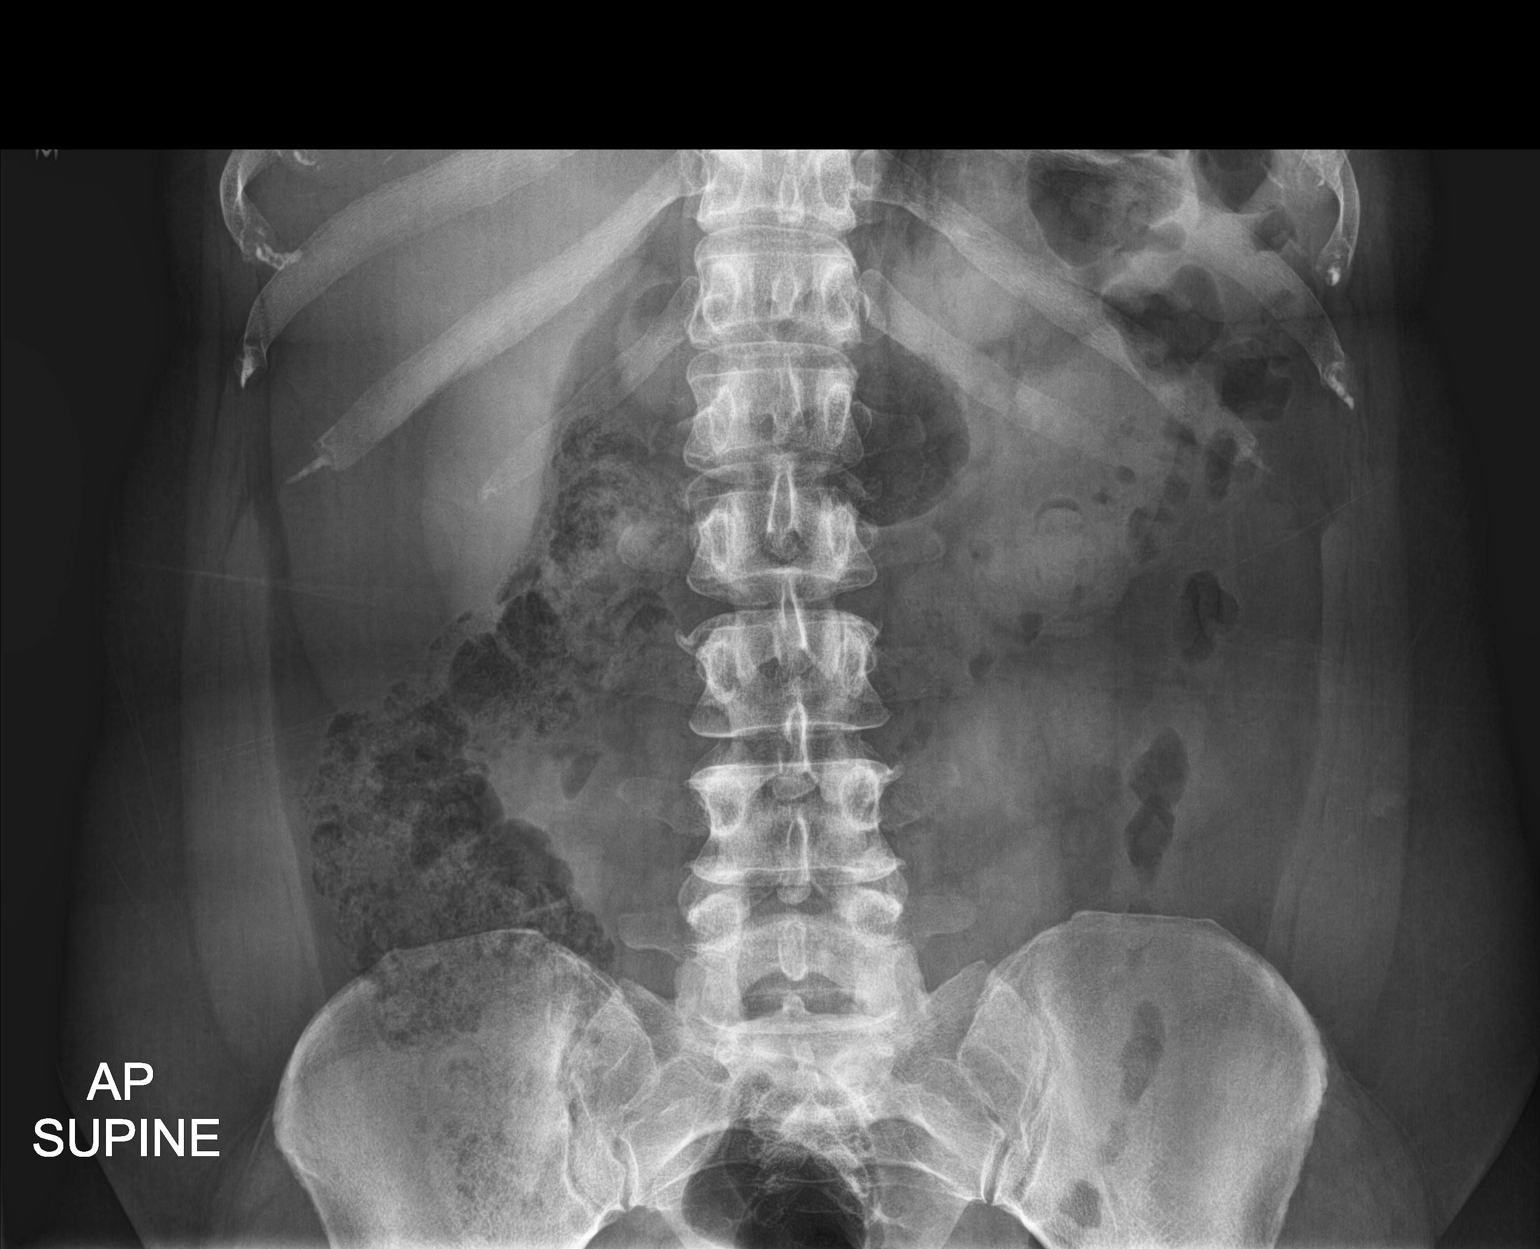

[abdomen kub (2 of 2)]
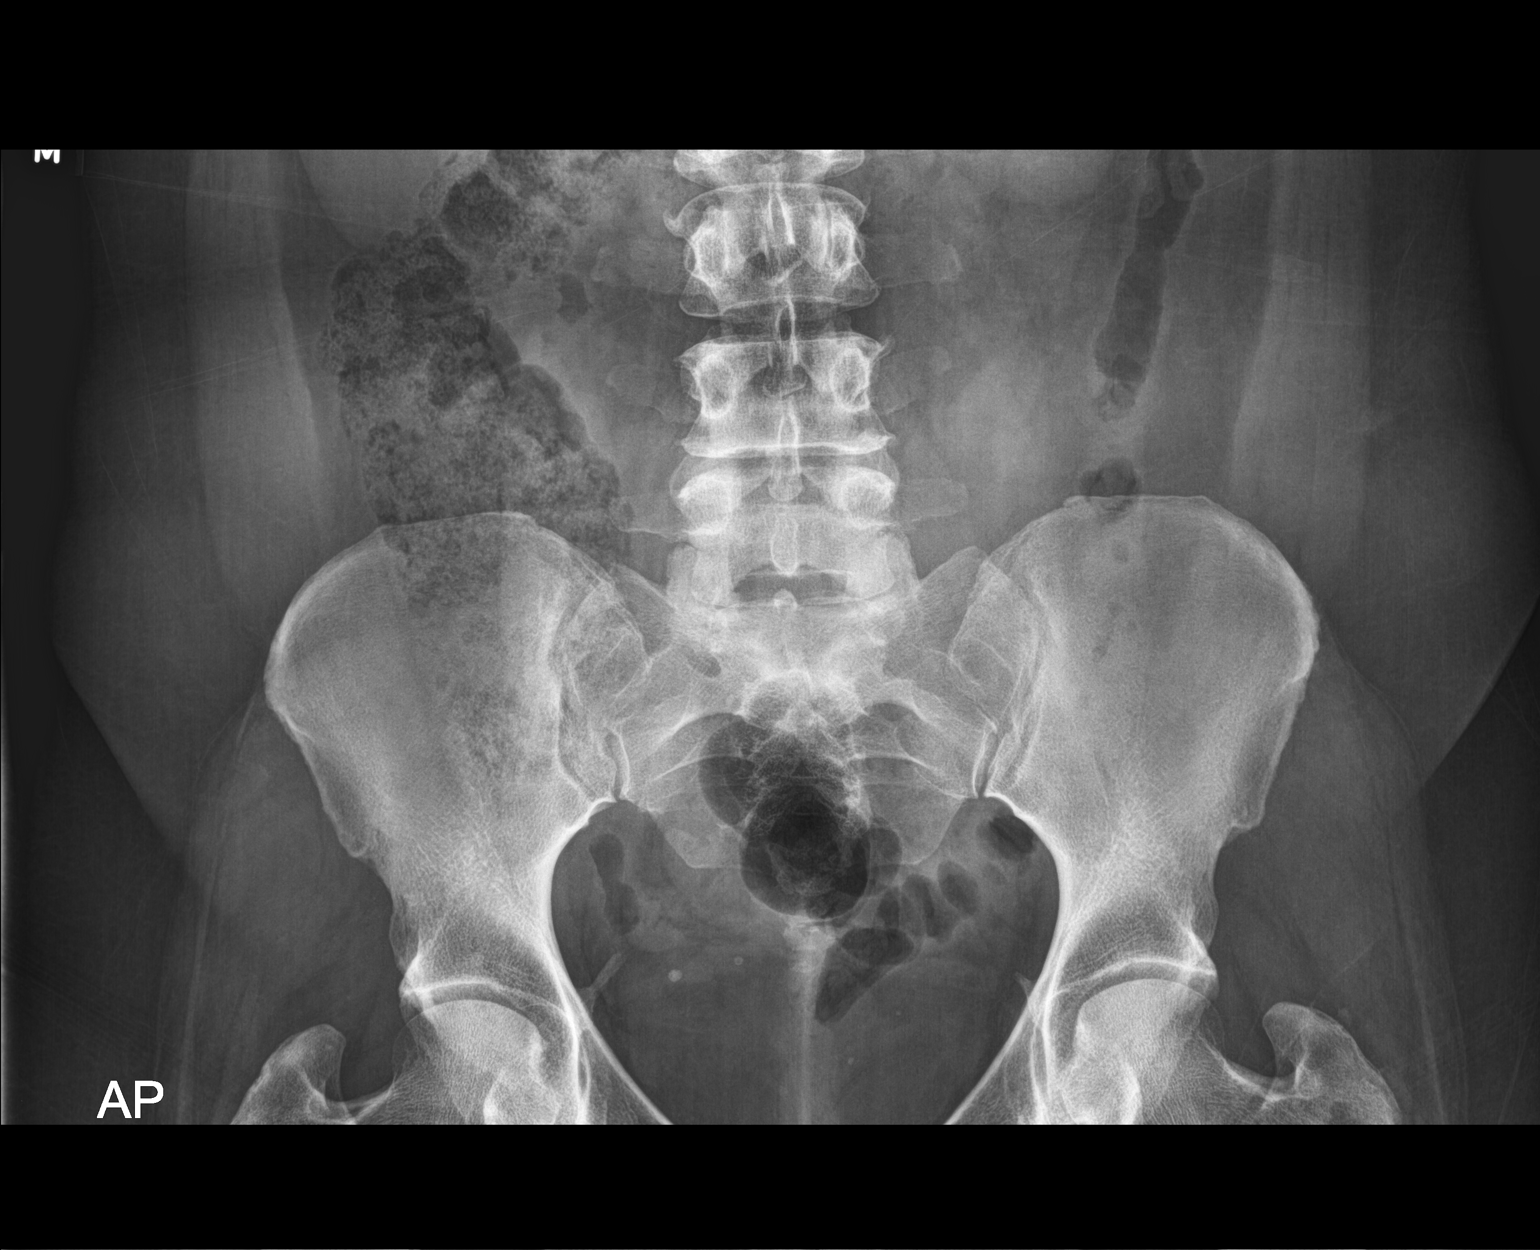

[2 of 2 positions shown; findings below may reference images not displayed]

FINDINGS: The bowel gas pattern is normal. No radio-opaque calculi or other
significant radiographic abnormality are seen.
IMPRESSION: Negative.

## 2018-09-27 ENCOUNTER — Encounter (HOSPITAL_COMMUNITY): Payer: Self-pay

## 2018-09-27 ENCOUNTER — Other Ambulatory Visit: Payer: Self-pay

## 2018-09-27 ENCOUNTER — Ambulatory Visit (HOSPITAL_COMMUNITY)
Admission: EM | Admit: 2018-09-27 | Discharge: 2018-09-27 | Disposition: A | Discharge status: Home / Self Care | Payer: BLUE CROSS/BLUE SHIELD | Attending: Family Medicine | Admitting: Family Medicine

## 2018-09-27 DIAGNOSIS — M549 Dorsalgia, unspecified: Secondary | ICD-10-CM | POA: Diagnosis not present

## 2018-09-27 DIAGNOSIS — I10 Essential (primary) hypertension: Secondary | ICD-10-CM | POA: Diagnosis not present

## 2018-09-27 LAB — POCT URINALYSIS DIP (DEVICE)
Bilirubin Urine: NEGATIVE
Bilirubin Urine: NEGATIVE
Glucose, UA: NEGATIVE mg/dL
Glucose, UA: NEGATIVE mg/dL
Hgb urine dipstick: NEGATIVE
Hgb urine dipstick: NEGATIVE
Ketones, ur: NEGATIVE mg/dL
Ketones, ur: NEGATIVE mg/dL
Nitrite: NEGATIVE
Nitrite: NEGATIVE
Protein, ur: NEGATIVE mg/dL
Protein, ur: NEGATIVE mg/dL
Specific Gravity, Urine: 1.015 (ref 1.005–1.030)
Specific Gravity, Urine: 1.02 (ref 1.005–1.030)
Urobilinogen, UA: 0.2 mg/dL (ref 0.0–1.0)
Urobilinogen, UA: 0.2 mg/dL (ref 0.0–1.0)
pH: 7 (ref 5.0–8.0)
pH: 7 (ref 5.0–8.0)

## 2018-09-27 MED ORDER — TIZANIDINE HCL 4 MG PO TABS: 4.0000 mg | ORAL_TABLET | Freq: Four times a day (QID) | ORAL | 0 refills | Status: AC | PRN

## 2018-09-27 MED ORDER — IBUPROFEN 800 MG PO TABS: 800.0000 mg | ORAL_TABLET | Freq: Three times a day (TID) | ORAL | 0 refills | Status: AC

## 2018-09-27 NOTE — ED Provider Notes (Signed)
[facility]    CSN: [id number] Arrival date & time: 09/27/18  0947     History   Chief Complaint Chief Complaint  Patient presents with  . Flank Pain    HPI Grace Warner is a 58 y.o. female.   HPI  Patient states she works from a [employer].  She works from home.  She does a lot of sitting.  She is had no accident or injury.  No fall.  No heavy lifting.  She periodically will get back pain.  She thinks the muscle strain is from poor posture in sitting for long periods of time.  Today she is here for right side pain.  She thinks it is the muscles in the right side of her back.  It is in the flank area.  No kidney stones or kidney infections.  No hematuria dysuria or fever.  She states that she is tried some over-the-counter medicine.  She is here because the pain is worse today than it was yesterday.  It hurts with movement.  Better with rest  Past Medical History:  Diagnosis Date  . Hypertension     There are no active problems to display for this patient.   History reviewed. No pertinent surgical history.  OB History   No obstetric history on file.      Home Medications    Prior to Admission medications   Medication Sig Start Date End Date Taking? Authorizing Provider  albuterol (PROVENTIL HFA;VENTOLIN HFA) 108 (90 BASE) MCG/ACT inhaler Inhale 2 puffs into the lungs 4 (four) times daily. 06/17/13   Reuben Likes, MD  budesonide-formoterol [facility]) 160-4.5 MCG/ACT inhaler Inhale 2 puffs into the lungs 2 (two) times daily. 07/18/13   Reuben Likes, MD  fluticasone (FLONASE) 50 MCG/ACT nasal spray Place 2 sprays into both nostrils daily. 06/17/13   Reuben Likes, MD  hydrochlorothiazide (HYDRODIURIL) 25 MG tablet Take 1 tablet (25 mg total) by mouth daily. 07/02/12   Moreno-Coll, Adlih, MD  ibuprofen (ADVIL) 800 MG tablet Take 1 tablet (800 mg total) by mouth 3 (three) times daily. 09/27/18   Eustace Moore, MD  omeprazole (PRILOSEC) 20 MG capsule Take  1 capsule (20 mg total) by mouth daily. 07/18/13   Reuben Likes, MD  tiZANidine (ZANAFLEX) 4 MG tablet Take 1-2 tablets (4-8 mg total) by mouth every 6 (six) hours as needed for muscle spasms. 09/27/18   Eustace Moore, MD    Family History History reviewed. No pertinent family history.  Social History Social History   Tobacco Use  . Smoking status: Never Smoker  . Smokeless tobacco: Never Used  Substance Use Topics  . Alcohol use: No  . Drug use: No     Allergies   Patient has no known allergies.   Review of Systems Review of Systems  Constitutional: Negative for chills and fever.  HENT: Negative for ear pain and sore throat.   Eyes: Negative for pain and visual disturbance.  Respiratory: Negative for cough and shortness of breath.   Cardiovascular: Negative for chest pain and palpitations.  Gastrointestinal: Negative for abdominal pain and vomiting.  Genitourinary: Negative for dysuria, flank pain, frequency and hematuria.  Musculoskeletal: Positive for back pain. Negative for arthralgias.  Skin: Negative for color change and rash.  Neurological: Negative for seizures and syncope.  All other systems reviewed and are negative.    Physical Exam Triage Vital Signs ED Triage Vitals  Enc Vitals Group     BP  09/27/18 1002 (!) 159/95     Pulse Rate 09/27/18 1002 92     Resp 09/27/18 1002 16     Temp 09/27/18 1002 98.3 F (36.8 C)     Temp Source 09/27/18 1002 Oral     SpO2 09/27/18 1002 98 %     Weight 09/27/18 1005 180 lb (81.6 kg)     Height --      Head Circumference --      Peak Flow --      Pain Score 09/27/18 1004 10     Pain Loc --      Pain Edu? --      Excl. in GC? --    No data found.  Updated Vital Signs BP (!) 159/95 (BP Location: Right Arm)   Pulse 92   Temp 98.3 F (36.8 C) (Oral)   Resp 16   Wt 81.6 kg   SpO2 98%   :     Physical Exam Constitutional:      General: She is not in acute distress.    Appearance: She is  well-developed.  HENT:     Head: Normocephalic and atraumatic.  Eyes:     Conjunctiva/sclera: Conjunctivae normal.     Pupils: Pupils are equal, round, and reactive to light.  Neck:     Musculoskeletal: Normal range of motion.  Cardiovascular:     Rate and Rhythm: Normal rate.  Pulmonary:     Effort: Pulmonary effort is normal. No respiratory distress.  Abdominal:     General: There is no distension.     Palpations: Abdomen is soft.  Musculoskeletal: Normal range of motion.     Comments: There is tenderness and a firmness to the right lumbar muscles.  No bony tenderness.  Good but slow range of motion.  No focal findings  Skin:    General: Skin is warm and dry.  Neurological:     General: No focal deficit present.     Mental Status: She is alert.     Sensory: No sensory deficit.     Motor: No weakness.     Gait: Gait normal.     Deep Tendon Reflexes: Reflexes normal.  Psychiatric:        Mood and Affect: Mood normal.        Behavior: Behavior normal.      UC Treatments / Results  Labs (all labs ordered are listed, but only abnormal results are displayed) Labs Reviewed  POCT URINALYSIS DIP (DEVICE) - Abnormal; Notable for the following components:      Result Value   Leukocytes,Ua SMALL (*)    All other components within normal limits  POCT URINALYSIS DIP (DEVICE) - Abnormal; Notable for the following components:   Leukocytes,Ua TRACE (*)    All other components within normal limits    EKG None  Radiology No results found.  Procedures Procedures (including critical care time)  Medications Ordered in UC Medications - No data to display  Initial Impression / Assessment and Plan / UC Course  I have reviewed the triage vital signs and the nursing notes.  Pertinent labs & imaging results that were available during my care of the patient were reviewed by me and considered in my medical decision making (see chart for details).     Muscular back pain.   Discussed Final Clinical Impressions(s) / UC Diagnoses   Final diagnoses:  Musculoskeletal back pain     Discharge Instructions     Activity as tolerated.  Move around, gently stretch, several times a day  Take ibuprofen 3 times a day with food Take tizanidine (muscle relaxer) as needed Ice or heat to area Gentle massage Call your primary care doctor if not improving towards the end of the week   ED Prescriptions    Medication Sig Dispense Auth. Provider   ibuprofen (ADVIL) 800 MG tablet Take 1 tablet (800 mg total) by mouth 3 (three) times daily. 21 tablet Eustace Moore, MD   tiZANidine (ZANAFLEX) 4 MG tablet Take 1-2 tablets (4-8 mg total) by mouth every 6 (six) hours as needed for muscle spasms. 21 tablet Eustace Moore, MD     Controlled Substance Prescriptions [place] Controlled Substance Registry consulted? Not Applicable   Eustace Moore, MD 09/27/18 1259

## 2018-09-27 NOTE — ED Triage Notes (Signed)
Pt states she has flank pain on her right side. Pt states worst when she tries to set down. She says she has no injury.

## 2018-09-27 NOTE — Discharge Instructions (Addendum)
Activity as tolerated.  Move around, gently stretch, several times a day  Take ibuprofen 3 times a day with food Take tizanidine (muscle relaxer) as needed Ice or heat to area Gentle massage Call your primary care doctor if not improving towards the end of the week

## 2019-07-21 ENCOUNTER — Ambulatory Visit: Payer: Self-pay

## 2019-07-29 ENCOUNTER — Ambulatory Visit: Payer: Self-pay

## 2019-08-04 ENCOUNTER — Ambulatory Visit: Payer: Self-pay | Attending: Internal Medicine

## 2019-08-04 DIAGNOSIS — Z23 Encounter for immunization: Secondary | ICD-10-CM

## 2019-08-04 NOTE — Progress Notes (Signed)
   Covid-19 Vaccination Clinic  Name:  Grace Warner    MRN: [id number] DOB: 28-Jul-1960  08/04/2019  [name] was observed post Covid-19 immunization for 15 minutes without incident. She was provided with Vaccine Information Sheet and instruction to access the V-Safe system.   [name] was instructed to call 911 with any severe reactions post vaccine: Marland Kitchen Difficulty breathing  . Swelling of face and throat  . A fast heartbeat  . A bad rash all over body  . Dizziness and weakness   Immunizations Administered    Name Date Dose VIS Date Route   Pfizer COVID-19 Vaccine 08/04/2019  5:05 PM 0.3 mL 04/28/2019 Intramuscular   Manufacturer: [employer], [employer]   Lot: [id number]   NDC: [address]-5

## 2019-08-30 ENCOUNTER — Ambulatory Visit: Payer: Self-pay | Attending: Internal Medicine

## 2019-08-30 DIAGNOSIS — Z23 Encounter for immunization: Secondary | ICD-10-CM

## 2019-08-30 NOTE — Progress Notes (Signed)
   Covid-19 Vaccination Clinic  Name:  Grace Warner    MRN: [id number] DOB: 12-Nov-1960  08/30/2019  Grace Warner was observed post Covid-19 immunization for 15 minutes without incident. She was provided with Vaccine Information Sheet and instruction to access the V-Safe system.   Grace Warner was instructed to call 911 with any severe reactions post vaccine: Marland Kitchen Difficulty breathing  . Swelling of face and throat  . A fast heartbeat  . A bad rash all over body  . Dizziness and weakness   Immunizations Administered    Name Date Dose VIS Date Route   Pfizer COVID-19 Vaccine 08/30/2019  3:58 PM 0.3 mL 04/28/2019 Intramuscular   Manufacturer: [employer], [employer]   Lot: [id number]   NDC: [address]-5
